# Patient Record
Sex: Female | Born: 1960 | Race: Black or African American | Hispanic: No | Smoking: Current every day smoker
Health system: Southern US, Community
[De-identification: ages and names within clinical notes are randomized; demographics above are authoritative.]

## PROBLEM LIST (undated history)

## (undated) DIAGNOSIS — J45909 Unspecified asthma, uncomplicated: Secondary | ICD-10-CM

## (undated) DIAGNOSIS — I10 Essential (primary) hypertension: Secondary | ICD-10-CM

## (undated) DIAGNOSIS — Z21 Asymptomatic human immunodeficiency virus [HIV] infection status: Secondary | ICD-10-CM

## (undated) DIAGNOSIS — B20 Human immunodeficiency virus [HIV] disease: Secondary | ICD-10-CM

## (undated) HISTORY — PX: ABDOMINAL HYSTERECTOMY: SHX81

---

## 2010-11-15 ENCOUNTER — Ambulatory Visit: Payer: Self-pay

## 2010-12-22 ENCOUNTER — Ambulatory Visit: Payer: Self-pay

## 2011-02-16 ENCOUNTER — Ambulatory Visit: Payer: Self-pay

## 2018-01-23 ENCOUNTER — Emergency Department (HOSPITAL_BASED_OUTPATIENT_CLINIC_OR_DEPARTMENT_OTHER)
Admission: EM | Admit: 2018-01-23 | Discharge: 2018-01-23 | Disposition: A | Discharge status: Home / Self Care | Payer: Self-pay | Attending: Emergency Medicine | Admitting: Emergency Medicine

## 2018-01-23 ENCOUNTER — Other Ambulatory Visit: Payer: Self-pay

## 2018-01-23 ENCOUNTER — Emergency Department (HOSPITAL_BASED_OUTPATIENT_CLINIC_OR_DEPARTMENT_OTHER): Payer: Self-pay

## 2018-01-23 ENCOUNTER — Encounter (HOSPITAL_BASED_OUTPATIENT_CLINIC_OR_DEPARTMENT_OTHER): Payer: Self-pay

## 2018-01-23 DIAGNOSIS — F17200 Nicotine dependence, unspecified, uncomplicated: Secondary | ICD-10-CM | POA: Insufficient documentation

## 2018-01-23 DIAGNOSIS — E871 Hypo-osmolality and hyponatremia: Secondary | ICD-10-CM

## 2018-01-23 DIAGNOSIS — R634 Abnormal weight loss: Secondary | ICD-10-CM

## 2018-01-23 DIAGNOSIS — I10 Essential (primary) hypertension: Secondary | ICD-10-CM | POA: Insufficient documentation

## 2018-01-23 DIAGNOSIS — E876 Hypokalemia: Secondary | ICD-10-CM

## 2018-01-23 HISTORY — DX: Essential (primary) hypertension: I10

## 2018-01-23 LAB — COMPREHENSIVE METABOLIC PANEL
ALT: 19 U/L (ref 0–44)
AST: 38 U/L (ref 15–41)
Albumin: 2.6 g/dL — ABNORMAL LOW (ref 3.5–5.0)
Alkaline Phosphatase: 66 U/L (ref 38–126)
Anion gap: 2 — ABNORMAL LOW (ref 5–15)
BILIRUBIN TOTAL: 0.5 mg/dL (ref 0.3–1.2)
BUN: 8 mg/dL (ref 6–20)
CO2: 26 mmol/L (ref 22–32)
Calcium: 8.4 mg/dL — ABNORMAL LOW (ref 8.9–10.3)
Chloride: 102 mmol/L (ref 98–111)
Creatinine, Ser: 0.58 mg/dL (ref 0.44–1.00)
Glucose, Bld: 77 mg/dL (ref 70–99)
POTASSIUM: 3.2 mmol/L — AB (ref 3.5–5.1)
Sodium: 130 mmol/L — ABNORMAL LOW (ref 135–145)
TOTAL PROTEIN: 11.2 g/dL — AB (ref 6.5–8.1)

## 2018-01-23 LAB — CBC WITH DIFFERENTIAL/PLATELET
BASOS PCT: 0 %
Basophils Absolute: 0 10*3/uL (ref 0.0–0.1)
EOS PCT: 0 %
Eosinophils Absolute: 0 10*3/uL (ref 0.0–0.7)
HEMATOCRIT: 37.7 % (ref 36.0–46.0)
Hemoglobin: 13 g/dL (ref 12.0–15.0)
LYMPHS ABS: 3 10*3/uL (ref 0.7–4.0)
Lymphocytes Relative: 72 %
MCH: 31.4 pg (ref 26.0–34.0)
MCHC: 34.5 g/dL (ref 30.0–36.0)
MCV: 91.1 fL (ref 78.0–100.0)
MONOS PCT: 8 %
Monocytes Absolute: 0.3 10*3/uL (ref 0.1–1.0)
NEUTROS ABS: 0.8 10*3/uL — AB (ref 1.7–7.7)
Neutrophils Relative %: 20 %
Platelets: 194 10*3/uL (ref 150–400)
RBC: 4.14 MIL/uL (ref 3.87–5.11)
RDW: 15.3 % (ref 11.5–15.5)
WBC: 4.1 10*3/uL (ref 4.0–10.5)

## 2018-01-23 LAB — URINALYSIS, ROUTINE W REFLEX MICROSCOPIC
Bilirubin Urine: NEGATIVE
GLUCOSE, UA: NEGATIVE mg/dL
KETONES UR: NEGATIVE mg/dL
LEUKOCYTES UA: NEGATIVE
Nitrite: NEGATIVE
Protein, ur: 30 mg/dL — AB
Specific Gravity, Urine: 1.015 (ref 1.005–1.030)
pH: 6 (ref 5.0–8.0)

## 2018-01-23 LAB — URINALYSIS, MICROSCOPIC (REFLEX)

## 2018-01-23 LAB — TSH: TSH: 1.411 u[IU]/mL (ref 0.350–4.500)

## 2018-01-23 MED ORDER — POTASSIUM CHLORIDE CRYS ER 20 MEQ PO TBCR
40.0000 meq | EXTENDED_RELEASE_TABLET | Freq: Once | ORAL | Status: AC
Start: 2018-01-23 — End: 2018-01-23
  Administered 2018-01-23: 40 meq via ORAL
  Filled 2018-01-23: qty 2

## 2018-01-23 NOTE — ED Provider Notes (Signed)
[facility] EMERGENCY DEPARTMENT Provider Note   CSN: [id number] Arrival date & time: 01/23/18  1237     History   Chief Complaint Chief Complaint  Patient presents with  . Weight Loss    HPI Laura Garcia is a 57 y.o. female who presents with complaints of 60 pounds of weight loss over the past 6 months.  She states that she was told she is diabetic last time she went to [facility] in April.  She states she followed up at [facility] and "they did not tell her anything".  She is on medication for high blood pressure but they did not start her on anything for diabetes.  She states that she does not have a good appetite and has been drinking boost.  She denies headache, shortness of breath, cough, abdominal pain.  She has occasional chest pain but cannot further characterize this.  She also has bilateral foot pain and a burning sensation and thinks this may be related to the diabetes.  She reports occasional night sweats.  She denies nausea, vomiting, diarrhea. On review of EMR, she was 150 pounds in March 2018. Glucose was 70 in April 2019.  HPI  Past Medical History:  Diagnosis Date  . Hypertension     There are no active problems to display for this patient.   Past Surgical History:  Procedure Laterality Date  . ABDOMINAL HYSTERECTOMY       OB History   None      Home Medications    Prior to Admission medications   Medication Sig Start Date End Date Taking? Authorizing Provider  butalbital-acetaminophen-caffeine (FIORICET, ESGIC) 50-325-40 MG tablet Take 1-2 tablets by mouth every 6 (six) hours as needed. 11/08/17 11/08/18 Yes [provider]    Family History No family history on file.  Social History Social History   Tobacco Use  . Smoking status: Current Every Day Smoker  . Smokeless tobacco: Never Used  Substance Use Topics  . Alcohol use: Not Currently    Comment: none x 2-3 weeks  . Drug use: Not Currently    Comment:  none x 1 month     Allergies   Patient has no known allergies.   Review of Systems Review of Systems  Constitutional: Positive for appetite change, diaphoresis and unexpected weight change. Negative for fever.  Respiratory: Negative for shortness of breath.   Cardiovascular: Positive for chest pain (intermittent).  Gastrointestinal: Negative for abdominal pain, diarrhea, nausea and vomiting.  Endocrine: Positive for polydipsia and polyuria. Negative for cold intolerance, heat intolerance and polyphagia.  Genitourinary: Negative for dysuria.  Neurological: Negative for headaches.  All other systems reviewed and are negative.    Physical Exam Updated Vital Signs BP 128/66 (BP Location: Left Arm)   Pulse 84   Temp 99 F (37.2 C) (Oral)   Resp 18   Ht 5\' 3"  (1.6 m)   Wt 54 kg (119 lb)   SpO2 100%   BMI 21.08 kg/m   Physical Exam  Constitutional: She is oriented to person, place, and time. She appears well-developed and well-nourished. No distress.  Calm and cooperative  HENT:  Head: Normocephalic and atraumatic.  Eyes: Pupils are equal, round, and reactive to light. Conjunctivae are normal. Right eye exhibits no discharge. Left eye exhibits no discharge. No scleral icterus.  Neck: Normal range of motion.  Cardiovascular: Normal rate and regular rhythm.  Pulmonary/Chest: Effort normal and breath sounds normal. No respiratory distress.  Abdominal: Soft.  Bowel sounds are normal. She exhibits no distension. There is no tenderness.  Musculoskeletal:  Bilateral feet are tender over the distal 5th metatarsal where there are callouses. 2+ DP pulses bilaterally  Neurological: She is alert and oriented to person, place, and time.  Skin: Skin is warm and dry.  Psychiatric: She has a normal mood and affect. Her behavior is normal.  Nursing note and vitals reviewed.    ED Treatments / Results  Labs (all labs ordered are listed, but only abnormal results are displayed) Labs  Reviewed  CBC WITH DIFFERENTIAL/PLATELET - Abnormal; Notable for the following components:      Result Value   Neutro Abs 0.8 (*)    All other components within normal limits  COMPREHENSIVE METABOLIC PANEL - Abnormal; Notable for the following components:   Sodium 130 (*)    Potassium 3.2 (*)    Calcium 8.4 (*)    Total Protein 11.2 (*)    Albumin 2.6 (*)    Anion gap 2 (*)    All other components within normal limits  URINALYSIS, ROUTINE W REFLEX MICROSCOPIC - Abnormal; Notable for the following components:   Hgb urine dipstick LARGE (*)    Protein, ur 30 (*)    All other components within normal limits  URINALYSIS, MICROSCOPIC (REFLEX) - Abnormal; Notable for the following components:   Bacteria, UA RARE (*)    All other components within normal limits  TSH  PATHOLOGIST SMEAR REVIEW    EKG None  Radiology Dg Chest 2 View  Result Date: 01/23/2018 CLINICAL DATA:  Chest pain EXAM: CHEST - 2 VIEW COMPARISON:  None. FINDINGS: The lungs are hyperinflated with diffuse interstitial prominence. No focal airspace consolidation or pulmonary edema. No pleural effusion or pneumothorax. Normal cardiomediastinal contours. IMPRESSION: COPD without acute airspace disease. Electronically Signed   By: Deatra Robinson M.D.   On: 01/23/2018 15:51    Procedures Procedures (including critical care time)  Medications Ordered in ED Medications  potassium chloride SA (K-DUR,KLOR-CON) CR tablet 40 mEq (40 mEq Oral Given 01/23/18 1639)     Initial Impression / Assessment and Plan / ED Course  I have reviewed the triage vital signs and the nursing notes.  Pertinent labs & imaging results that were available during my care of the patient were reviewed by me and considered in my medical decision making (see chart for details).  57 year old female presents with unintentional weight loss for months and bilateral foot pain for months. Her vitals are normal. Her exam is unremarkable. She has no acute  complaints. Will obtain labs, CXR.   CBC is normal. CMP is remarkable for hyponatremia (130), hypokalemia (3.2), hypocalcemia (8.4), low albumin (2.6). UA has large hgb and 30 protein. CXR is negative for acute process. Results were discussed with the patient. She was encouraged to continue nutritional supplementation and follow up with her PCP.  Final Clinical Impressions(s) / ED Diagnoses   Final diagnoses:  Unintentional weight loss  Hypokalemia  Hyponatremia  Hypocalcemia    ED Discharge Orders    None       Bethel Born, PA-C 01/23/18 1652    Melene Plan, DO 01/23/18 1915

## 2018-01-23 NOTE — ED Notes (Signed)
Pt verbalizes understanding of d/c instructions and denies any further needs at this time. 

## 2018-01-23 NOTE — Discharge Instructions (Signed)
Please continue nutritional supplements [facility] You can check the results of your thyroid test on MyChart

## 2018-01-23 NOTE — ED Triage Notes (Signed)
Pt c/o approx 60 lb weight loss over the last 6 months-c/o bilat foot pain-NAD-steady gait

## 2018-01-24 LAB — PATHOLOGIST SMEAR REVIEW

## 2018-09-04 ENCOUNTER — Emergency Department (HOSPITAL_BASED_OUTPATIENT_CLINIC_OR_DEPARTMENT_OTHER)
Admission: EM | Admit: 2018-09-04 | Discharge: 2018-09-04 | Disposition: A | Discharge status: Home / Self Care | Payer: Medicaid Other | Attending: Emergency Medicine | Admitting: Emergency Medicine

## 2018-09-04 ENCOUNTER — Encounter (HOSPITAL_BASED_OUTPATIENT_CLINIC_OR_DEPARTMENT_OTHER): Payer: Self-pay | Admitting: *Deleted

## 2018-09-04 ENCOUNTER — Other Ambulatory Visit: Payer: Self-pay

## 2018-09-04 DIAGNOSIS — M79671 Pain in right foot: Secondary | ICD-10-CM | POA: Insufficient documentation

## 2018-09-04 DIAGNOSIS — M21372 Foot drop, left foot: Secondary | ICD-10-CM | POA: Insufficient documentation

## 2018-09-04 DIAGNOSIS — I10 Essential (primary) hypertension: Secondary | ICD-10-CM | POA: Insufficient documentation

## 2018-09-04 DIAGNOSIS — B20 Human immunodeficiency virus [HIV] disease: Secondary | ICD-10-CM | POA: Insufficient documentation

## 2018-09-04 DIAGNOSIS — J45909 Unspecified asthma, uncomplicated: Secondary | ICD-10-CM | POA: Diagnosis not present

## 2018-09-04 DIAGNOSIS — F17200 Nicotine dependence, unspecified, uncomplicated: Secondary | ICD-10-CM | POA: Diagnosis not present

## 2018-09-04 DIAGNOSIS — M79672 Pain in left foot: Secondary | ICD-10-CM | POA: Diagnosis present

## 2018-09-04 HISTORY — DX: Asymptomatic human immunodeficiency virus (hiv) infection status: Z21

## 2018-09-04 HISTORY — DX: Unspecified asthma, uncomplicated: J45.909

## 2018-09-04 HISTORY — DX: Human immunodeficiency virus (HIV) disease: B20

## 2018-09-04 MED ORDER — DICLOFENAC SODIUM 25 MG PO TBEC: 25.0000 mg | DELAYED_RELEASE_TABLET | Freq: Two times a day (BID) | ORAL | 0 refills | Status: DC

## 2018-09-04 MED ORDER — DICLOFENAC SODIUM 50 MG PO TBEC: 50.0000 mg | DELAYED_RELEASE_TABLET | Freq: Two times a day (BID) | ORAL | 0 refills | Status: AC

## 2018-09-04 MED FILL — DICLOFENAC SOD EC 50 MG TAB: 50 | 10 days supply | Qty: 20 | Fill #0

## 2018-09-04 NOTE — Discharge Instructions (Signed)
Follow-up with your provider as currently scheduled. Take Voltaren as needed as prescribed for pain.

## 2018-09-04 NOTE — ED Provider Notes (Signed)
[facility] EMERGENCY DEPARTMENT Provider Note   CSN: [id number] Arrival date & time: 09/04/18  1347     History   Chief Complaint Chief Complaint  Patient presents with  . Ankle Pain    HPI Laura Garcia is a 58 y.o. female.  58 year old female with history of HIV and hepatitis C presents with complaint of bilateral foot pain and numbness with left foot drop.  Patient states symptoms started after she fell down a few steps in December while carrying the trash out at home.  Patient states that she was seen at [facility] the day of the fall however has not been seen since that time.  Patient's primary care provider is through the community [facility] in [facility].  Reports pain and numbness to the distal aspect of both feet, difficulty walking due to weakness in her left foot.  The recent falls or injuries since that time.  No other complaints or concerns.     Past Medical History:  Diagnosis Date  . Asthma   . HIV (human immunodeficiency virus infection) (HCC)   . Hypertension     There are no active problems to display for this patient.   Past Surgical History:  Procedure Laterality Date  . ABDOMINAL HYSTERECTOMY       OB History   No obstetric history on file.      Home Medications    Prior to Admission medications   Medication Sig Start Date End Date Taking? Authorizing Provider  diclofenac (VOLTAREN) 25 MG EC tablet Take 1 tablet (25 mg total) by mouth 2 (two) times daily for 10 days. 09/04/18 09/14/18  Jeannie Fend, PA-C    Family History History reviewed. No pertinent family history.  Social History Social History   Tobacco Use  . Smoking status: Current Every Day Smoker    Packs/day: 0.50  . Smokeless tobacco: Never Used  Substance Use Topics  . Alcohol use: Yes    Alcohol/week: 1.0 standard drinks    Types: 1 Standard drinks or equivalent per week  . Drug use: Not Currently    Comment: none x 1 month     Allergies     Patient has no known allergies.   Review of Systems Review of Systems  Constitutional: Negative for chills and fever.  Musculoskeletal: Positive for arthralgias, gait problem and myalgias.  Skin: Negative for color change, rash and wound.  Allergic/Immunologic: Positive for immunocompromised state.  Neurological: Positive for weakness and numbness.  All other systems reviewed and are negative.    Physical Exam Updated Vital Signs BP (!) 143/83   Pulse (!) 103   Temp 98.6 F (37 C) (Oral)   Resp 18   Ht 5\' 3"  (1.6 m)   Wt 49.4 kg   SpO2 100%   BMI 19.31 kg/m   Physical Exam Vitals signs and nursing note reviewed.  Constitutional:      General: She is not in acute distress.    Appearance: She is well-developed. She is not diaphoretic.  HENT:     Head: Normocephalic and atraumatic.  Cardiovascular:     Pulses: Normal pulses.  Pulmonary:     Effort: Pulmonary effort is normal.  Musculoskeletal:        General: Tenderness present. No swelling or deformity.     Right lower leg: No edema.     Left lower leg: No edema.     Right foot: Normal capillary refill. Tenderness present. No swelling, crepitus, deformity or laceration.  Left foot: Decreased range of motion. Normal capillary refill. Tenderness present. No swelling, crepitus, deformity or laceration.       Feet:     Comments: Unable to dorsiflex left toes and foot.  Skin:    General: Skin is warm and dry.     Findings: No erythema or rash.  Neurological:     Mental Status: She is alert and oriented to person, place, and time.  Psychiatric:        Behavior: Behavior normal.      ED Treatments / Results  Labs (all labs ordered are listed, but only abnormal results are displayed) Labs Reviewed - No data to display  EKG None  Radiology No results found.  Procedures Procedures (including critical care time)  Medications Ordered in ED Medications - No data to display   Initial Impression /  Assessment and Plan / ED Course  I have reviewed the triage vital signs and the nursing notes.  Pertinent labs & imaging results that were available during my care of the patient were reviewed by me and considered in my medical decision making (see chart for details).  Clinical Course as of Sep 05 1435  Wed Sep 04, 2018  28 58 year old female presents with complaint of pain to both of her feet with weakness in her left foot.  Patient states is been ongoing since a fall in December, had x-rays done at [facility] at that time and denies having any further treatment or testing since that time.  Review of medical records shows patient has been followed up, very recently had an MRI of her lumbar spine on January 30 which did not show any cause for her symptoms today.  Discussed records with patient who acknowledges she has followed up however is in pain and needs something to take for her pain.  Patient was given prescription for diclofenac and advised to follow-up as planned with her care team.  Patient verbalizes understanding of her discharge instructions and plan.   [LM]    Clinical Course User Index [LM] Jeannie Fend, PA-C   Final Clinical Impressions(s) / ED Diagnoses   Final diagnoses:  Foot drop, left    ED Discharge Orders         Ordered    diclofenac (VOLTAREN) 25 MG EC tablet  2 times daily,   Status:  Discontinued     09/04/18 1432    diclofenac (VOLTAREN) 25 MG EC tablet  2 times daily     09/04/18 1432           Alden Hipp 09/04/18 1437    Charlynne Pander, MD 09/04/18 [phone]

## 2018-09-04 NOTE — ED Triage Notes (Signed)
Pt /o fall x 2 months ago cont left ankle pain and swelling.

## 2018-12-15 IMAGING — DX DG CHEST 2V
2 series · 2 of 2 positions shown · non-contrast
Comparison: None.

CLINICAL DATA: Chest pain

EXAM:
CHEST - 2 VIEW

[chest pa]
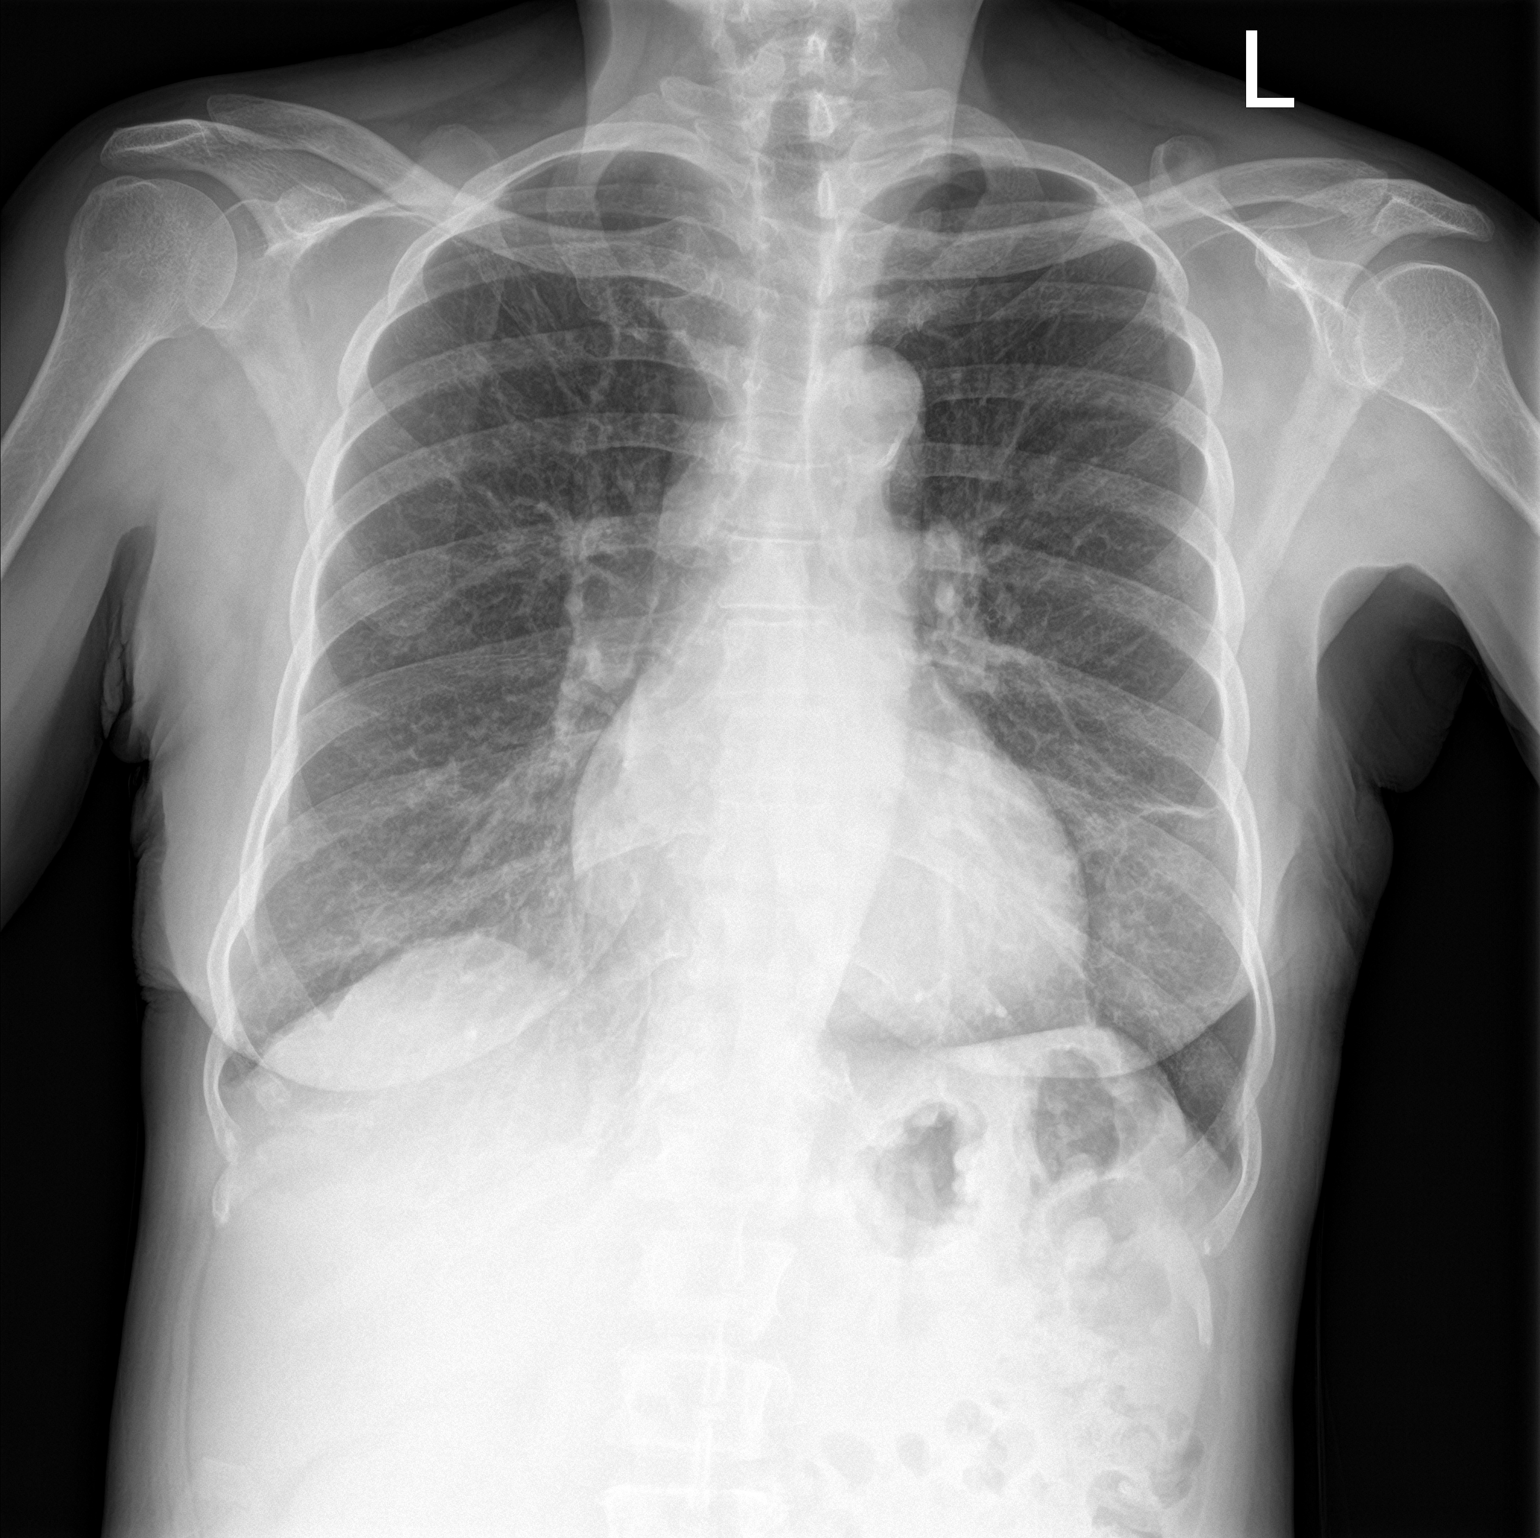

[chest lat]
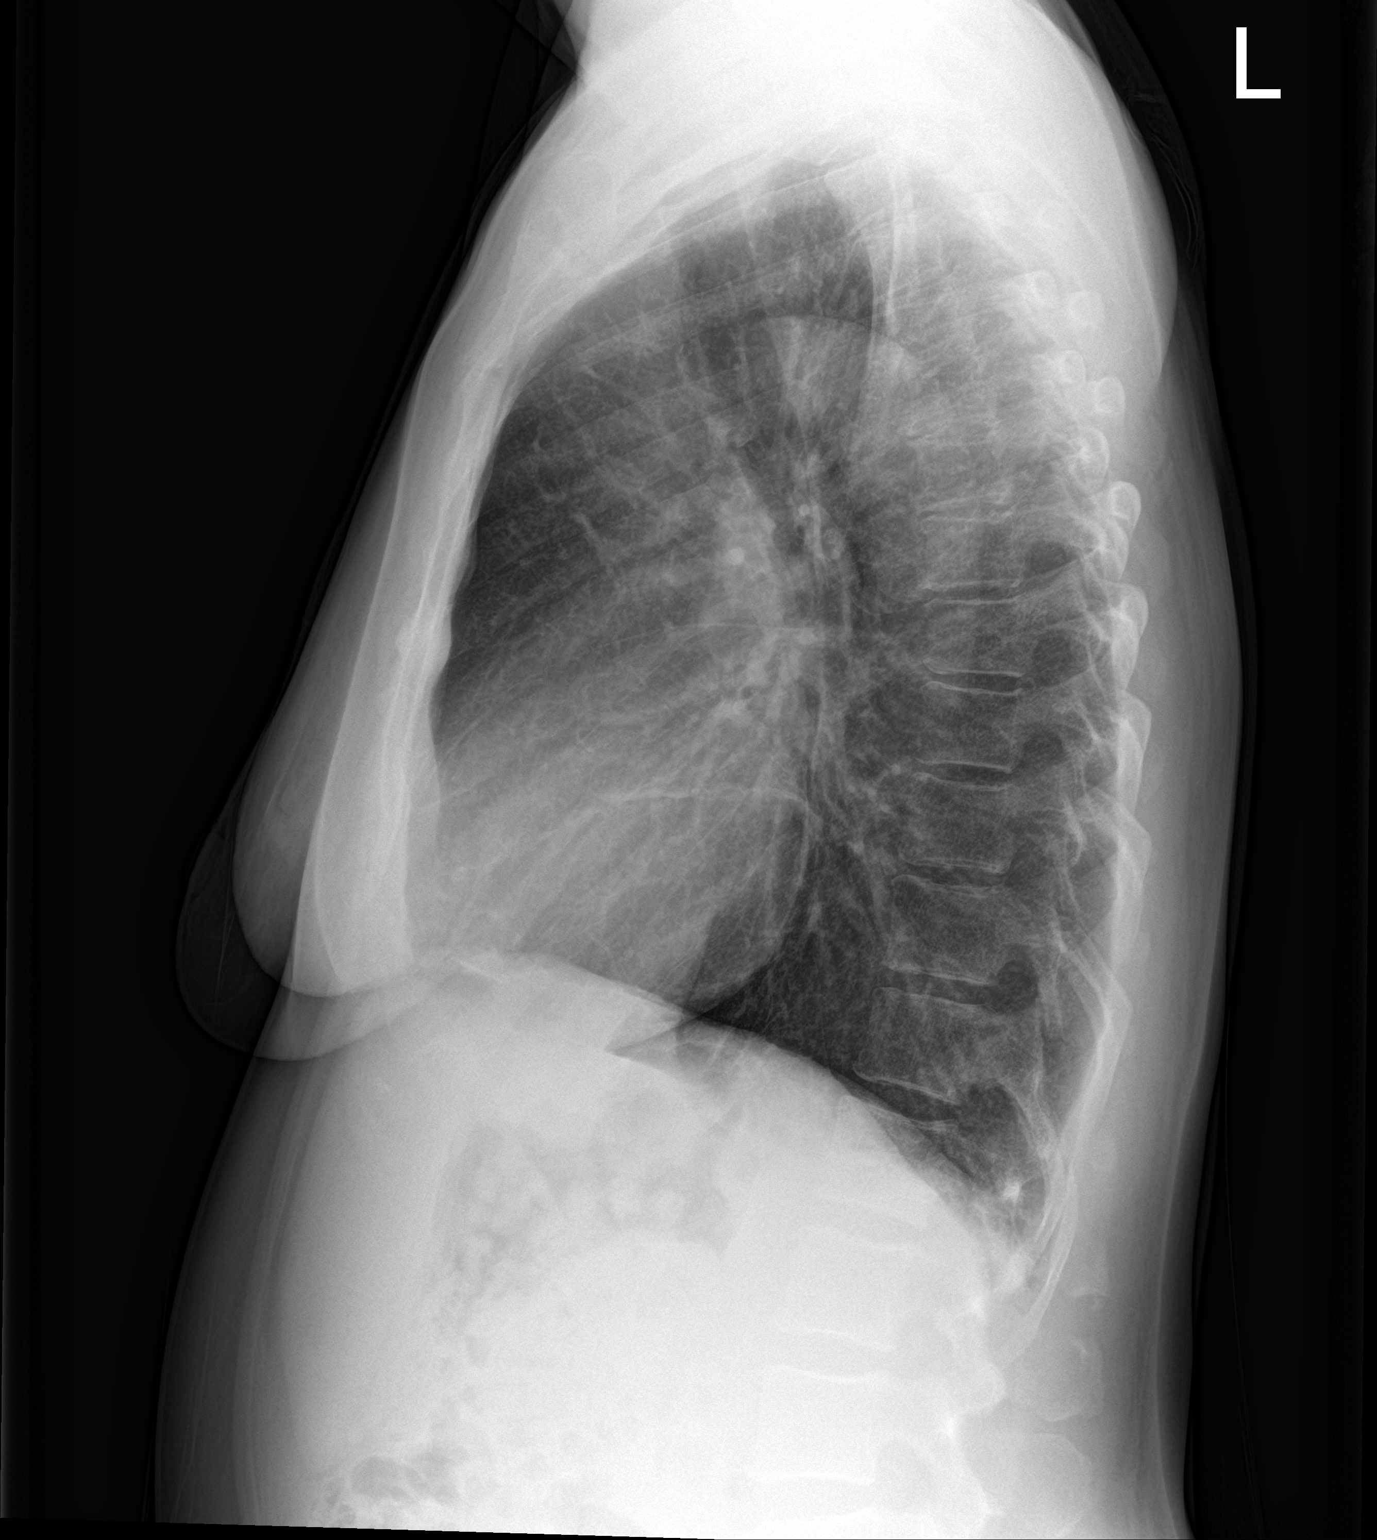

[2 of 2 positions shown; findings below may reference images not displayed]

FINDINGS: The lungs are hyperinflated with diffuse interstitial prominence. No
focal airspace consolidation or pulmonary edema. No pleural effusion
or pneumothorax. Normal cardiomediastinal contours.
IMPRESSION: COPD without acute airspace disease.
